# Patient Record
Sex: Female | Born: 1940 | Race: White | Hispanic: No
Health system: Southern US, Community
[De-identification: ages and names within clinical notes are randomized; demographics above are authoritative.]

---

## 2009-02-11 ENCOUNTER — Ambulatory Visit: Admission: RE | Admit: 2009-02-11 | Discharge: 2009-02-11 | Payer: Self-pay | Admitting: Gynecology

## 2009-02-24 ENCOUNTER — Encounter: Payer: Self-pay | Admitting: Gynecologic Oncology

## 2009-02-24 ENCOUNTER — Inpatient Hospital Stay (HOSPITAL_COMMUNITY): Admission: RE | Admit: 2009-02-24 | Discharge: 2009-02-26 | Payer: Self-pay | Admitting: Obstetrics & Gynecology

## 2009-04-01 ENCOUNTER — Ambulatory Visit: Admission: RE | Admit: 2009-04-01 | Discharge: 2009-04-01 | Payer: Self-pay | Admitting: Gynecologic Oncology

## 2010-11-11 IMAGING — CR DG CHEST 2V
2 series · 2 of 2 positions shown · non-contrast
Comparison: None

CLINICAL DATA: Preoperative study for pelvic mass.

CHEST - 2 VIEW

[view not recorded (1 of 2)]
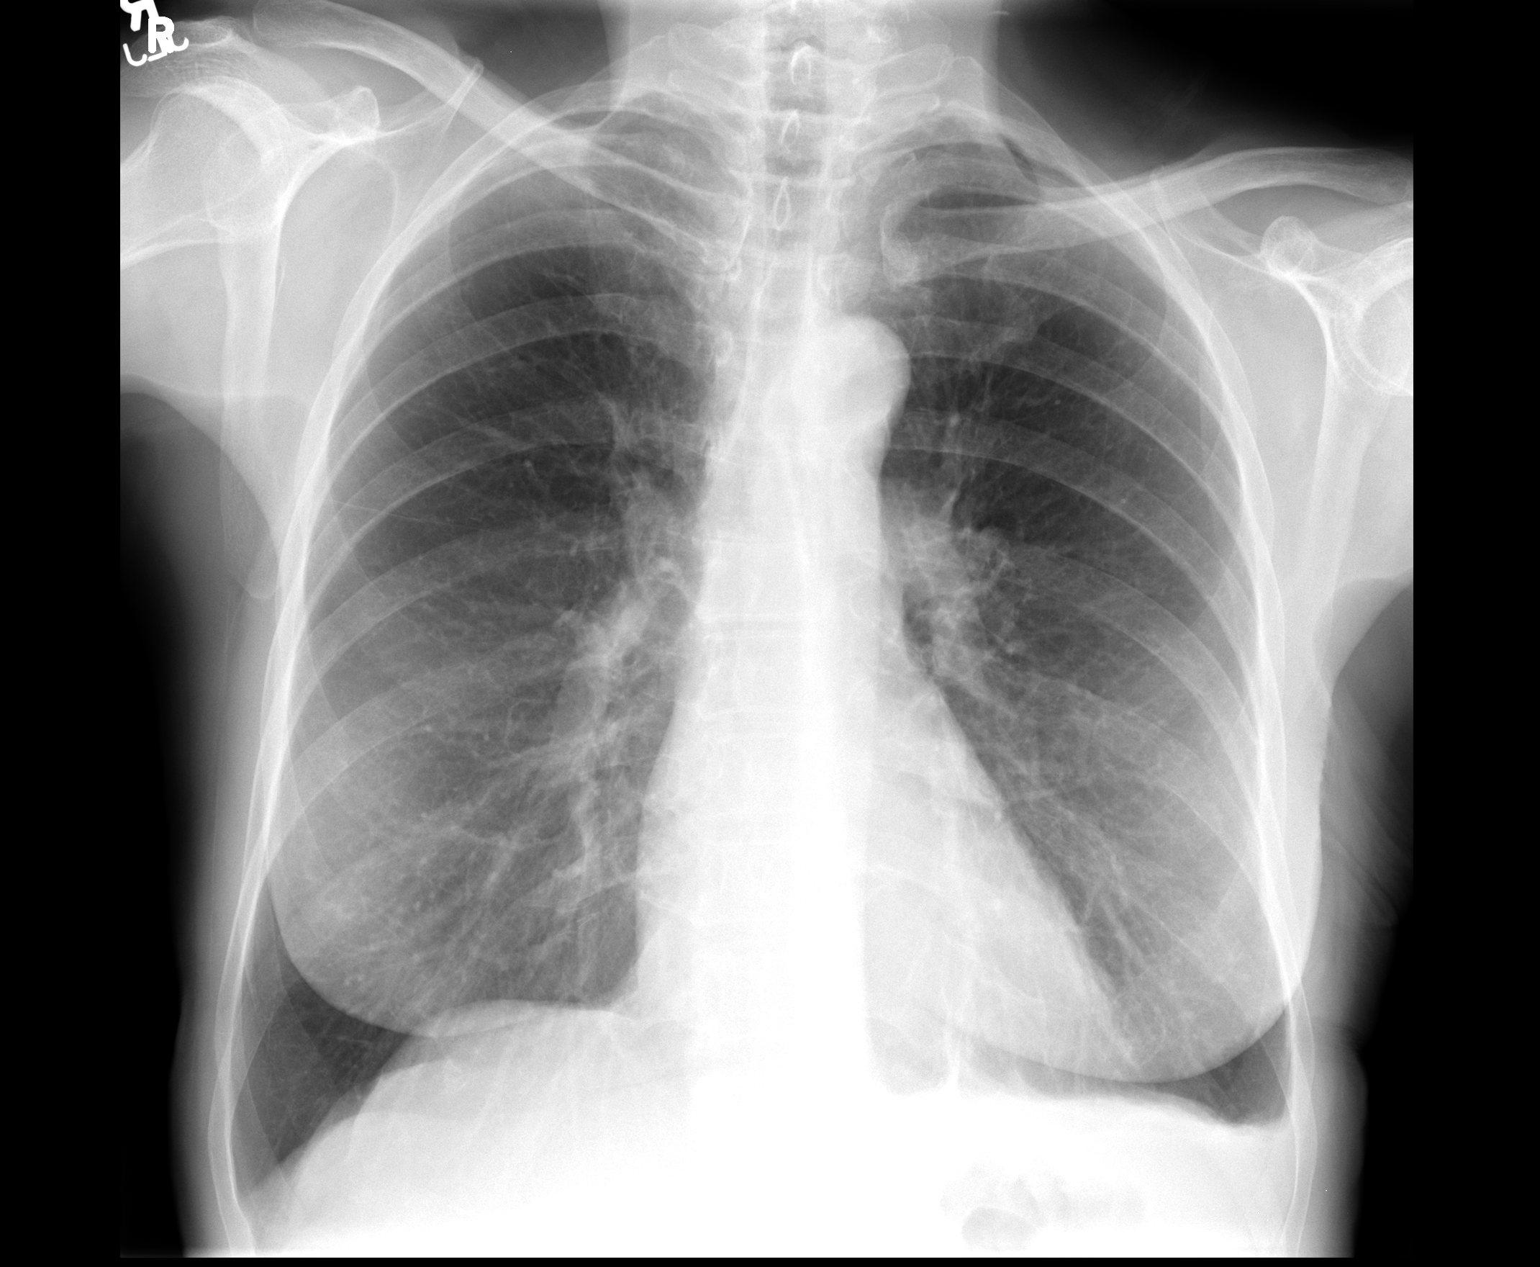

[view not recorded (2 of 2)]
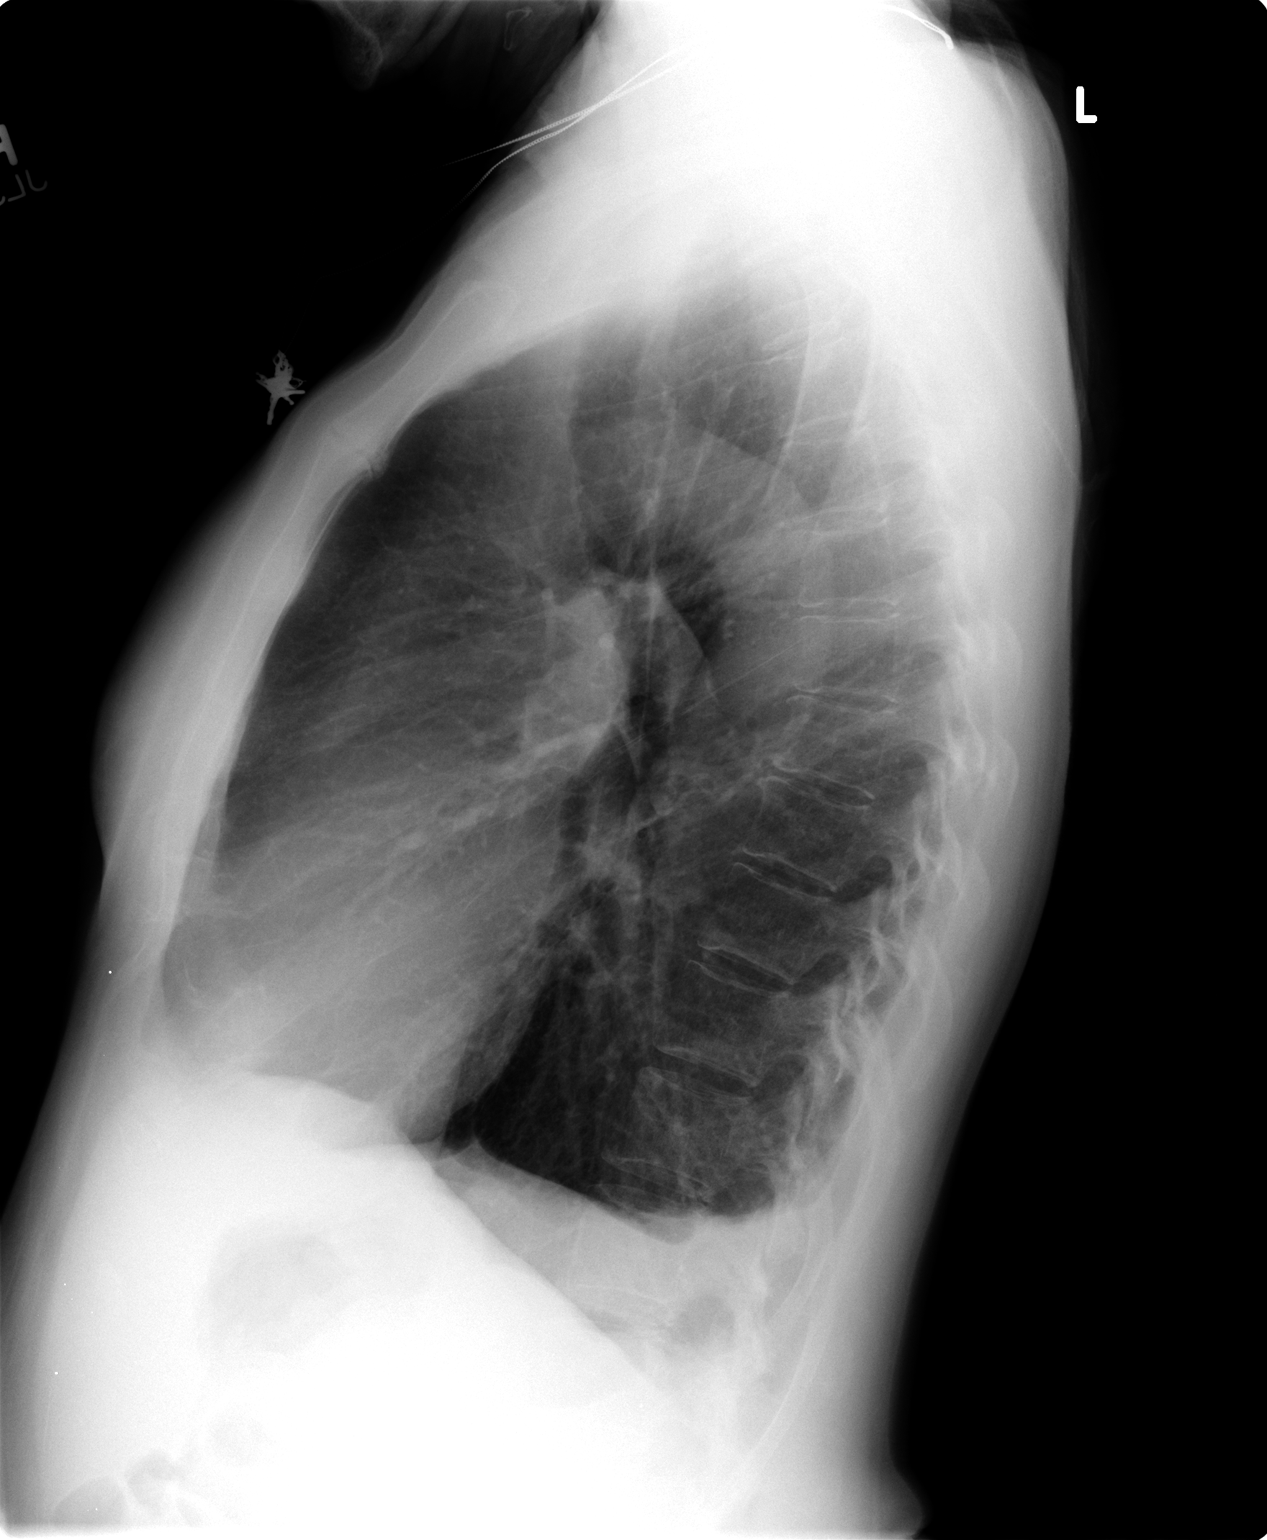

[2 of 2 positions shown; findings below may reference images not displayed]

FINDINGS: The heart size is normal and the vascularity is normal.
There is a small left pleural effusion and mild left lower lobe
atelectasis.  No mass lesion is identified.  There is COPD with
hyperinflation.
IMPRESSION: COPD with small left pleural effusion.

## 2011-04-07 LAB — BASIC METABOLIC PANEL
Calcium: 8.1 mg/dL — ABNORMAL LOW (ref 8.4–10.5)
GFR calc non Af Amer: >60 mL/min (ref >60)
Potassium: 3.6 mEq/L (ref 3.5–5.1)
Sodium: 135 mEq/L (ref 135–145)

## 2011-04-07 LAB — CBC
HCT: 37.9 % (ref 36.0–46.0)
Hemoglobin: 12.6 g/dL (ref 12.0–15.0)
Platelets: 184 10*3/uL (ref 150–400)
WBC: 8.4 10*3/uL (ref 4.0–10.5)

## 2011-04-12 LAB — COMPREHENSIVE METABOLIC PANEL
ALT: 11 U/L (ref 0–35)
AST: 15 U/L (ref 0–37)
Albumin: 3.6 g/dL (ref 3.5–5.2)
Alkaline Phosphatase: 56 U/L (ref 39–117)
BUN: 6 mg/dL (ref 6–23)
Chloride: 99 mEq/L (ref 96–112)
Potassium: 3.5 mEq/L (ref 3.5–5.1)
Sodium: 135 mEq/L (ref 135–145)
Total Bilirubin: 0.4 mg/dL (ref 0.3–1.2)
Total Protein: 6.4 g/dL (ref 6.0–8.3)

## 2011-04-12 LAB — DIFFERENTIAL
Basophils Absolute: 0 10*3/uL (ref 0.0–0.1)
Basophils Relative: 0 % (ref 0–1)
Eosinophils Absolute: 0.1 10*3/uL (ref 0.0–0.7)
Eosinophils Relative: 2 % (ref 0–5)
Monocytes Absolute: 0.4 10*3/uL (ref 0.1–1.0)
Monocytes Relative: 6 % (ref 3–12)
Neutro Abs: 5.5 10*3/uL (ref 1.7–7.7)

## 2011-04-12 LAB — CBC
HCT: 40 % (ref 36.0–46.0)
Hemoglobin: 13.3 g/dL (ref 12.0–15.0)
Platelets: 284 10*3/uL (ref 150–400)
WBC: 7.1 10*3/uL (ref 4.0–10.5)

## 2011-04-12 LAB — TYPE AND SCREEN: Antibody Screen: NEGATIVE

## 2011-05-10 NOTE — Consult Note (Signed)
NAMEMarland Kitchen  Sherry, Singleton NO.:  [id number]   MEDICAL RECORD NO.:  [id number]          PATIENT TYPE:  OUT   LOCATION:  GYN                          FACILITY:  [facility]   PHYSICIAN:  Laurette Schimke, MD     DATE OF BIRTH:  22-Mar-1941   DATE OF CONSULTATION:  04/01/2009  DATE OF DISCHARGE:                                 CONSULTATION   REASON FOR VISIT:  Postoperative check.   HISTORY OF PRESENT ILLNESS:  This is a 70 year old referred by Dr.  Thamas Jaegers for a pelvic mass.  The preoperative CT scan notes a 14 x 13 x 19  cm complex right adnexal mass.  An endometrial biopsy was in conclusive.  On February 24, 2009, she underwent an exploratory laparotomy, total  abdominal hysterectomy, bilateral salpingo-oophorectomy.  The right  ovary and fallopian tube were notable for an ovary with fibroma.  There  was also noted the presence of a benign endometrium with simple  hyperplasia without atypia and a small benign peritubal cyst on the left  fallopian tube.  Postoperatively, Sherry Singleton has done very well.  At  this visit, she indicates that she has resumed many of her activities of  daily living.  Reports hot flashes.  She states that she has resumed her  tobacco use.   PHYSICAL EXAMINATION:  GENERAL:  This is a well-developed female in no  acute distress.  VITAL SIGNS:  Weight 120 pounds.  ABDOMEN:  Soft, nontender.  Incision is clean, dry and intact.  There is  no evidence of a hernia.  No tenderness or erythema.  PELVIC:  Cuff is intact.  There are no pelvic masses.   IMPRESSION:  1. Status post total abdominal hysterectomy, bilateral salpingo-      oophorectomy for a benign ovarian fibroma and incidental      endometrial hyperplasia without atypia.  Sherry Singleton was informed      the results. She will follow up with Dr Oliva Bustard for routine      GYN care.  2. With respect to her complaints of insomnia, Tylenol PM has been      recommended.  3. With regard to institution  of hormonal therapy, given her extensive      tobacco use, she probably would benefit from statin use prior to      initiation of hormone therapy.      Laurette Schimke, MD  Electronically Signed     WB/MEDQ  D:  04/01/2009  T:  04/01/2009  Job:  [id number]   cc:   Telford Nab, R.N.  [address]  [place], [place] [address]   Oliva Bustard  Fax: [phone]

## 2011-05-10 NOTE — Consult Note (Signed)
NAME:  Sherry Singleton, Sherry Singleton NO.:  [id number]   MEDICAL RECORD NO.:  [id number]          PATIENT TYPE:  OUT   LOCATION:  GYN                          FACILITY:  [facility]   PHYSICIAN:  De Blanch, M.D.DATE OF BIRTH:  17-Jun-1941   DATE OF CONSULTATION:  02/11/2009  DATE OF DISCHARGE:                                 CONSULTATION   CHIEF COMPLAINT:  Ascites and pelvic mass.   HISTORY OF PRESENT ILLNESS:  A 70 year old white female seen in  consultation at the request of Dr. Oliva Bustard regarding management of  newly diagnosed ascites and a pelvic mass.  The patient presented with  abdominal distention of approximately 2 weeks' duration.  A CT scan was  obtained showing a large amount of intra-abdominal ascites, peritoneal  metastatic disease, and a 14 x 13 x 19 cm complex pelvic mass, probably  arising from the right ovary.  In addition, there was an enhancing mass  in the endometrium measuring 4.9 x 4.0 x 3.0 cm.  Endometrial biopsy was  obtained which was inconclusive but did not show any evidence of  malignancy or hyperplasia, although the specimen was scanty.  CA-125 on  February 12 which 330 units per mL.  She denies any nausea, vomiting or  any weight loss.   PAST MEDICAL HISTORY:  Medical illnesses:  None.   CURRENT MEDICATIONS:  None.   DRUG ALLERGIES:  None.   PAST SURGICAL HISTORY:  Bilateral tubal ligation.   OBSTETRICAL HISTORY:  Gravida 5, para 4.   SOCIAL HISTORY:  The patient smokes 1 pack per day.  She has a long  career as a Psychiatric nurse and most recently worked at [employer] at [employer] until last year when she retired completely.  She comes accompanied  by one of her daughters today.   REVIEW OF SYSTEMS:  A 10-point comprehensive review of systems negative  except as noted above.   PHYSICAL EXAMINATION:  Weight 137 pounds, height 5 feet 6-1/2.  Blood  pressure 124/84, pulse 100.  GENERAL:  The patient is a pleasant white female in  no acute distress.  HEENT:  Is negative.  NECK:  Supple without thyromegaly.  There is no supraclavicular or  inguinal adenopathy.  The abdomen is distended with ascites.  She has a shifting dullness and  fluid wave.  No discrete masses or omental cake is noted.  PELVIC EXAM:  EGBUS, vagina, bladder urethra are normal.  Cervix is  normal.  Uterus is difficult to outline, but I believe normal shape,  size, consistency.  There is a right adnexal mass that extends nearly to  the umbilicus which is difficult to fully outline because of the  patient's abdominal distention from the ascites.  Rectovaginal exam  confirms.  I do not feel any cul-de-sac nodularity.   IMPRESSION:  Large abdominal pelvic mass which is complex, ascites, and  elevated CA-125.  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  I indicated that I would recommend she undergo  exploratory laparotomy, total abdominal hysterectomy, bilateral salpingo-  oophorectomy, intraoperative frozen section and debulking and/or  surgical staging if she is found to have a malignancy.  We will go ahead  with preoperative testing, and the patient wished to proceed with  surgery as soon as it is possible.      De Blanch, M.D.  Electronically Signed     DC/MEDQ  D:  02/11/2009  T:  02/11/2009  Job:  [id number]   cc:   Oliva Bustard  Fax: [phone]   Celso Amy  [facility]   Telford Nab, R.N.  [phone] [address]  [place], [place] [address]

## 2011-05-10 NOTE — Discharge Summary (Signed)
NAMEMarland Kitchen  Sherry Singleton, Sherry Singleton NO.:  [id number]   MEDICAL RECORD NO.:  [id number]          PATIENT TYPE:  INP   LOCATION:  1523                         FACILITY:  [facility]   PHYSICIAN:  Roseanna Rainbow, M.D.DATE OF BIRTH:  09-05-1941   DATE OF ADMISSION:  02/24/2009  DATE OF DISCHARGE:  02/26/2009                               DISCHARGE SUMMARY   CHIEF COMPLAINT:  The patient is a 70 year old who presents for  operative management of a newly diagnosed pelvic mass with associated  ascites.  Please see the dictated history and physical as per Dr. De Blanch.   HOSPITAL COURSE:  The patient was admitted and underwent total abdominal  hysterectomy and bilateral salpingo-oophorectomy.  Please see the  dictated operative summary.  Her postoperative course was uneventful.  Her diet was gradually advanced.  She was discharged to home on  postoperative day 2 tolerating a regular diet.   DISCHARGE DIAGNOSES:  Right ovarian fibroma, paratubal cyst of  follicular and serous cyst, chronic cervicitis, endometrial polyp,  simple hyperplasia without atypia, leiomyomata.   PROCEDURES:  Total abdominal hysterectomy and bilateral salpingo-  oophorectomy.   CONDITION:  Good.   DIET:  Regular.   ACTIVITY:  Pelvic rest, progressive activity.   MEDICATIONS:  Percocet 1-2 tablets every 6 hours as needed.   FOLLOW UP:  The patient was to follow up in the GYN Oncology office on  March 02, 2009 at 10 a.m.      Roseanna Rainbow, M.D.  Electronically Signed     LAJ/MEDQ  D:  03/31/2009  T:  03/31/2009  Job:  [id number]   cc:   Telford Nab, R.N.  [address]  [place], [place] [address]   [facility]   Oliva Bustard  Fax: [phone]

## 2011-05-10 NOTE — Op Note (Signed)
NAMEMarland Kitchen  CYENNA, REBELLO NO.:  [id number]   MEDICAL RECORD NO.:  [id number]          PATIENT TYPE:  INP   LOCATION:  1523                         FACILITY:  [facility]   PHYSICIAN:  Paola A. Duard Brady, MD    DATE OF BIRTH:  29-Nov-1941   DATE OF PROCEDURE:  02/24/2009  DATE OF DISCHARGE:                               OPERATIVE REPORT   PREOPERATIVE DIAGNOSIS:  Pelvic mass, elevated CA-125.   POSTOPERATIVE DIAGNOSIS:  Fibroma with torsion and necrosis.   PROCEDURE:  Total abdominal hysterectomy, bilateral salpingo-  oophorectomy.   SURGEON:  Paola A. Duard Brady, M.D, Roseanna Rainbow, M.D.   ASSISTANT:  Telford Nab, R.N.   ANESTHESIA:  General.   ANESTHESIOLOGIST:  Quentin Cornwall. Council Mechanic, M.D.   ESTIMATED BLOOD LOSS:  100 ml.   URINE OUTPUT:  125 ml.   IV FLUIDS:  2100 ml.   SPECIMENS:  Included washings of uterus, cervix.  Bilateral tubes and  ovaries sent to pathology.   OPERATIVE FINDINGS:  A 16 cm right ovarian mass that appeared both  cystic and solid with evidence of some necrosis.  The uterus and  contralateral adnexa were unremarkable.  The peritoneal surfaces were  smooth.  There was no evidence of any peritoneal-based disease or  omental thickening.  Frozen section returned as a benign fibroma with  evidence of necrosis and torsion.   Patient was taken to the operating room, placed in supine position,  where general anesthesia was induced.  She was then placed in the dorsal  lithotomy position with all appropriate precautions and prophylaxis.  The patient's abdomen was prepped in the usual fashion.  Perineum and  vagina were prepped in the usual sterile fashion, and a Foley catheter  was inserted into the bladder under sterile conditions.  The patient was  then draped in the usual fashion.  A time-out was performed to confirm  the patient, the procedure, antibiotic, and allergy status as well as  surgeons.  A vertical midline infraumbilical incision  was made.  Patient's old scar from her tubal ligation was excised.  We dissected  down to the underlying fascia using Bovie cautery.  The fascia was  identified, scored in the midline of the fascia.  The incision was  extended superiorly and inferiorly.  The peritoneal cavity was  identified, and the peritoneal incision was extended superiorly and  inferiorly for visualization of the peritoneal cavity.  At this point,  examination revealed the adnexal masses noted.  There was approximately  50 ml of amber-colored ascites that was sent to pathology.  The right  adnexa was delivered from the patient's abdomen without difficulty.  We  then clamped across the uterine ovarian pedicle with 2 curved Masterson  clamps.  The specimens were dissected and sent to pathology.  We suture-  ligated the pedicle of the utero-ovarian.  The Bookwalter self-retaining  retractor was then placed in the patient's abdomen with all appropriate  precautions.  Patient was placed in Trendelenburg, and the small bowel  was packed out of the way with moist laparotomy sponges.  Additional  abdominopelvic washings  were obtained.  The uterus was grasped with  curved Mastersons at the level of the cornua.  The sidewall retractors  were placed.  At this point and in all portions of the procedure, we  examined the side-wall retractors to insure that there is no significant  pressure on the psoas bellies.  Our attention was first drawn to the  left pelvic side wall.  The round ligament was transected with monopolar  cautery.  The anterior and posterior leafs of the broad ligament were  then opened using monopolar cautery.  The ureter was identified.  A  window was made between the IP and ureter.  The IP was clamped x2,  transected and suture ligated.  We then skeletonized the uterine artery  after creating the bladder flap.  The uterine artery was clamped with  curved Masterson at the level of the cervical isthmic junction.   It was  transected and suture ligated.  Our attention was then drawn to the  patient's right side, and a similar procedure was performed.  We opened  the anterior and posterior lips of the broad ligament.  The ureters were  identified, and a window was made between the IP and the ureter.  The IP  was clamped x2, transected, and suture-ligated.  We then skeletonized  the uterine vessel on the patient's right side and created the bladder  flap adequately.  At the cervical isthmic junction, we came across with  a curved Masterson, transected the uterine vessel, suture ligating it.  There was a small amount of bleeding on the peritoneal surface with a  small vessel.  We then dissected the ureter well free of the medial  peritoneum on the patient's left side and 3 hemoclips were placed to  obtain hemostasis.  Again, we rechecked to make sure that the ureter was  well medial of this area of the hemoclip.  We then continued down the  cardinal ligament with each clamp, creating a pedicle with a curved  Masterson, transecting until we came to the cervical vaginal junction.  We then came across the cervix at the level of the vagina.  The cervix  was amputated from the vagina.  The vaginal cuff was closed using figure-  of-eight sutures of 0 Vicryl.  At this point, frozen section returned as  benign disease.  All pedicles were resected.  The abdomen and pelvis  were copiously irrigated.  All laparotomy sponges were removed.  Retractors were removed.  The laparotomy sponge was correct.  The fascia  was closed in a mass closure with a #1 PDS.  The subcu tissues were  irrigated and then made hemostatic.  The skin was closed using skin  clips.   The patient tolerated the procedure well and was taken to the recovery  room in stable condition.  All instrument, needle, RayTec, and  laparotomy counts were correct x2.      Paola A. Duard Brady, MD  Electronically Signed     PAG/MEDQ  D:  02/24/2009  T:   02/24/2009  Job:  [id number]   cc:   Oliva Bustard  Fax: [phone]   Roseanna Rainbow, M.D.  Fax: [phone]   Telford Nab, R.N.  [address]  [place], [place] [address]

## 2022-08-04 DIAGNOSIS — Z7901 Long term (current) use of anticoagulants: Secondary | ICD-10-CM | POA: Diagnosis not present

## 2022-08-04 DIAGNOSIS — M810 Age-related osteoporosis without current pathological fracture: Secondary | ICD-10-CM | POA: Diagnosis not present

## 2022-08-04 DIAGNOSIS — R110 Nausea: Secondary | ICD-10-CM | POA: Diagnosis not present

## 2022-08-04 DIAGNOSIS — S72002D Fracture of unspecified part of neck of left femur, subsequent encounter for closed fracture with routine healing: Secondary | ICD-10-CM | POA: Diagnosis not present

## 2022-08-04 DIAGNOSIS — R0902 Hypoxemia: Secondary | ICD-10-CM | POA: Diagnosis not present

## 2022-08-04 DIAGNOSIS — Z7401 Bed confinement status: Secondary | ICD-10-CM | POA: Diagnosis not present

## 2022-08-04 DIAGNOSIS — J439 Emphysema, unspecified: Secondary | ICD-10-CM | POA: Diagnosis not present

## 2022-08-04 DIAGNOSIS — W19XXXA Unspecified fall, initial encounter: Secondary | ICD-10-CM | POA: Diagnosis not present

## 2022-08-04 DIAGNOSIS — M84652A Pathological fracture in other disease, left femur, initial encounter for fracture: Secondary | ICD-10-CM | POA: Diagnosis not present

## 2022-08-04 DIAGNOSIS — R0602 Shortness of breath: Secondary | ICD-10-CM | POA: Diagnosis not present

## 2022-08-04 DIAGNOSIS — K5900 Constipation, unspecified: Secondary | ICD-10-CM | POA: Diagnosis not present

## 2022-08-04 DIAGNOSIS — R52 Pain, unspecified: Secondary | ICD-10-CM | POA: Diagnosis not present

## 2022-08-04 DIAGNOSIS — Z743 Need for continuous supervision: Secondary | ICD-10-CM | POA: Diagnosis not present

## 2022-08-04 DIAGNOSIS — R42 Dizziness and giddiness: Secondary | ICD-10-CM | POA: Diagnosis not present

## 2022-08-04 DIAGNOSIS — I700 Atherosclerosis of aorta: Secondary | ICD-10-CM | POA: Diagnosis not present

## 2022-08-04 DIAGNOSIS — M25552 Pain in left hip: Secondary | ICD-10-CM | POA: Diagnosis not present

## 2022-08-04 DIAGNOSIS — F17210 Nicotine dependence, cigarettes, uncomplicated: Secondary | ICD-10-CM | POA: Diagnosis not present

## 2022-08-04 DIAGNOSIS — G4700 Insomnia, unspecified: Secondary | ICD-10-CM | POA: Diagnosis not present

## 2022-08-04 DIAGNOSIS — W19XXXD Unspecified fall, subsequent encounter: Secondary | ICD-10-CM | POA: Diagnosis not present

## 2022-08-04 DIAGNOSIS — W010XXA Fall on same level from slipping, tripping and stumbling without subsequent striking against object, initial encounter: Secondary | ICD-10-CM | POA: Diagnosis not present

## 2022-08-04 DIAGNOSIS — S72012A Unspecified intracapsular fracture of left femur, initial encounter for closed fracture: Secondary | ICD-10-CM | POA: Diagnosis not present

## 2022-08-04 DIAGNOSIS — S72002A Fracture of unspecified part of neck of left femur, initial encounter for closed fracture: Secondary | ICD-10-CM | POA: Diagnosis not present

## 2022-08-04 DIAGNOSIS — M1612 Unilateral primary osteoarthritis, left hip: Secondary | ICD-10-CM | POA: Diagnosis not present

## 2022-08-04 DIAGNOSIS — S72042D Displaced fracture of base of neck of left femur, subsequent encounter for closed fracture with routine healing: Secondary | ICD-10-CM | POA: Diagnosis not present

## 2022-08-04 DIAGNOSIS — Z96642 Presence of left artificial hip joint: Secondary | ICD-10-CM | POA: Diagnosis not present

## 2022-08-04 DIAGNOSIS — S299XXA Unspecified injury of thorax, initial encounter: Secondary | ICD-10-CM | POA: Diagnosis not present

## 2022-08-04 DIAGNOSIS — Z471 Aftercare following joint replacement surgery: Secondary | ICD-10-CM | POA: Diagnosis not present

## 2022-08-04 DIAGNOSIS — Z4889 Encounter for other specified surgical aftercare: Secondary | ICD-10-CM | POA: Diagnosis not present

## 2022-08-04 DIAGNOSIS — Z4789 Encounter for other orthopedic aftercare: Secondary | ICD-10-CM | POA: Diagnosis not present

## 2022-08-04 DIAGNOSIS — R531 Weakness: Secondary | ICD-10-CM | POA: Diagnosis not present

## 2022-08-04 DIAGNOSIS — Z720 Tobacco use: Secondary | ICD-10-CM | POA: Diagnosis not present

## 2022-08-04 DIAGNOSIS — R5381 Other malaise: Secondary | ICD-10-CM | POA: Diagnosis not present

## 2022-08-06 DIAGNOSIS — Z7901 Long term (current) use of anticoagulants: Secondary | ICD-10-CM | POA: Diagnosis not present

## 2022-08-06 DIAGNOSIS — D649 Anemia, unspecified: Secondary | ICD-10-CM | POA: Diagnosis not present

## 2022-08-06 DIAGNOSIS — F17210 Nicotine dependence, cigarettes, uncomplicated: Secondary | ICD-10-CM | POA: Diagnosis not present

## 2022-08-06 DIAGNOSIS — W19XXXD Unspecified fall, subsequent encounter: Secondary | ICD-10-CM | POA: Diagnosis not present

## 2022-08-06 DIAGNOSIS — S72002D Fracture of unspecified part of neck of left femur, subsequent encounter for closed fracture with routine healing: Secondary | ICD-10-CM | POA: Diagnosis not present

## 2022-08-06 DIAGNOSIS — G4700 Insomnia, unspecified: Secondary | ICD-10-CM | POA: Diagnosis not present

## 2022-08-06 DIAGNOSIS — S72002A Fracture of unspecified part of neck of left femur, initial encounter for closed fracture: Secondary | ICD-10-CM | POA: Diagnosis not present

## 2022-08-06 DIAGNOSIS — R5381 Other malaise: Secondary | ICD-10-CM | POA: Diagnosis not present

## 2022-08-06 DIAGNOSIS — W010XXA Fall on same level from slipping, tripping and stumbling without subsequent striking against object, initial encounter: Secondary | ICD-10-CM | POA: Diagnosis not present

## 2022-08-06 DIAGNOSIS — Z4789 Encounter for other orthopedic aftercare: Secondary | ICD-10-CM | POA: Diagnosis not present

## 2022-08-06 DIAGNOSIS — Z7401 Bed confinement status: Secondary | ICD-10-CM | POA: Diagnosis not present

## 2022-08-06 DIAGNOSIS — R52 Pain, unspecified: Secondary | ICD-10-CM | POA: Diagnosis not present

## 2022-08-06 DIAGNOSIS — M25552 Pain in left hip: Secondary | ICD-10-CM | POA: Diagnosis not present

## 2022-08-06 DIAGNOSIS — K5900 Constipation, unspecified: Secondary | ICD-10-CM | POA: Diagnosis not present

## 2022-08-06 DIAGNOSIS — Z96642 Presence of left artificial hip joint: Secondary | ICD-10-CM | POA: Diagnosis not present

## 2022-08-06 DIAGNOSIS — S72012A Unspecified intracapsular fracture of left femur, initial encounter for closed fracture: Secondary | ICD-10-CM | POA: Diagnosis not present

## 2022-08-06 DIAGNOSIS — Z720 Tobacco use: Secondary | ICD-10-CM | POA: Diagnosis not present

## 2022-08-06 DIAGNOSIS — S72042D Displaced fracture of base of neck of left femur, subsequent encounter for closed fracture with routine healing: Secondary | ICD-10-CM | POA: Diagnosis not present

## 2022-08-06 DIAGNOSIS — R531 Weakness: Secondary | ICD-10-CM | POA: Diagnosis not present

## 2022-08-06 DIAGNOSIS — Z4889 Encounter for other specified surgical aftercare: Secondary | ICD-10-CM | POA: Diagnosis not present

## 2022-08-06 DIAGNOSIS — Z743 Need for continuous supervision: Secondary | ICD-10-CM | POA: Diagnosis not present

## 2022-08-06 DIAGNOSIS — M810 Age-related osteoporosis without current pathological fracture: Secondary | ICD-10-CM | POA: Diagnosis not present

## 2022-08-08 DIAGNOSIS — M810 Age-related osteoporosis without current pathological fracture: Secondary | ICD-10-CM | POA: Diagnosis not present

## 2022-08-08 DIAGNOSIS — D649 Anemia, unspecified: Secondary | ICD-10-CM | POA: Diagnosis not present

## 2022-08-08 DIAGNOSIS — S72002A Fracture of unspecified part of neck of left femur, initial encounter for closed fracture: Secondary | ICD-10-CM | POA: Diagnosis not present

## 2022-08-12 ENCOUNTER — Other Ambulatory Visit: Payer: Self-pay | Admitting: *Deleted

## 2022-08-12 NOTE — Patient Outreach (Signed)
Sherry Singleton resides in [name]. Screening for potential [name] care coordination/care management needs as a benefit of insurance plan.  Communication with [name] SW to make aware writer is following for progression and transition plans. [name] reports care plan meeting to be scheduled.  Will continue to follow.  [name], MSN, RN,BSN [name] [phone] [name]) [phone]  (Toll free office)

## 2022-08-15 ENCOUNTER — Other Ambulatory Visit: Payer: Self-pay | Admitting: *Deleted

## 2022-08-15 NOTE — Patient Outreach (Signed)
[facility] Coordinator follow up.  Facility site visit to [facility]. Met with Mrs. Sherry Singleton at beside to discuss transition plans and potential [facility] care coordination/care management needs.   Sherry Singleton reports she lives with her 2 sons. States she is s/p hip surgery and stiches come out tomorrow. States she plans to return home with sons.  Made Sherry Singleton aware that she can call [facility] customer service line for transportation and meal needs. Denies having concerns with transportation and meals.  Discussed need for PCP. Sherry Singleton states she has not needed one in the past. However, admits to needing PCP now as she is aging. Discussed [facility] for home visits as primary care provider.  Sherry Singleton reports she thinks she had a referral for [facility] in the past but she never called back. States she is not interested in home visits due to her dog. Discussed writer will follow up with her to provide PCP information inn her area. Will also make SNF SW aware of conversation regarding PCP needs.   Provided [facility] Care Management brochure, contact information, and 24-hr nurse advice line magnet.   Will continue to follow.    Marthenia Rolling, MSN, RN,BSN [place] Acute Care Coordinator [phone] [facility]) [phone]  (Toll free office)

## 2022-08-19 ENCOUNTER — Other Ambulatory Visit: Payer: Self-pay | Admitting: *Deleted

## 2022-08-19 DIAGNOSIS — Z720 Tobacco use: Secondary | ICD-10-CM | POA: Diagnosis not present

## 2022-08-19 DIAGNOSIS — S72002A Fracture of unspecified part of neck of left femur, initial encounter for closed fracture: Secondary | ICD-10-CM | POA: Diagnosis not present

## 2022-08-19 DIAGNOSIS — M810 Age-related osteoporosis without current pathological fracture: Secondary | ICD-10-CM | POA: Diagnosis not present

## 2022-08-19 NOTE — Patient Outreach (Signed)
[facility] Post- Acute Care Coordinator follow up.  Per [facility] Sherry Singleton discharged from [facility]. Telephone call made to Sherry Singleton at [phone] to confirm she has PCP follow up. No answer. HIPAA compliant voicemail message left to request return call.    [name], MSN, RN,BSN [facility] Post Acute Care Coordinator [phone] [facility]) [phone]  (Toll free office)

## 2022-08-22 DIAGNOSIS — Z7982 Long term (current) use of aspirin: Secondary | ICD-10-CM | POA: Diagnosis not present

## 2022-08-22 DIAGNOSIS — Z87891 Personal history of nicotine dependence: Secondary | ICD-10-CM | POA: Diagnosis not present

## 2022-08-22 DIAGNOSIS — Z96642 Presence of left artificial hip joint: Secondary | ICD-10-CM | POA: Diagnosis not present

## 2022-08-22 DIAGNOSIS — M810 Age-related osteoporosis without current pathological fracture: Secondary | ICD-10-CM | POA: Diagnosis not present

## 2022-08-22 DIAGNOSIS — R54 Age-related physical debility: Secondary | ICD-10-CM | POA: Diagnosis not present

## 2022-08-23 ENCOUNTER — Other Ambulatory Visit: Payer: Self-pay | Admitting: *Deleted

## 2022-08-23 NOTE — Patient Outreach (Signed)
[facility] Post- Acute Care Coordinator follow up. Verified in [facility] Sherry Singleton discharged from [facility] on 08/19/22.   Telephone call made to Sherry Singleton [phone]. Patient identifiers confirmed. Sherry Singleton states she was arranged with PCP follow up with [facility]. Denies having any [facility] care coordination needs.    [name], MSN, RN,BSN [facility] Post Acute Care Coordinator [phone] [facility]) [phone]  (Toll free office)

## 2022-08-24 DIAGNOSIS — S72002D Fracture of unspecified part of neck of left femur, subsequent encounter for closed fracture with routine healing: Secondary | ICD-10-CM | POA: Diagnosis not present

## 2022-08-24 DIAGNOSIS — G894 Chronic pain syndrome: Secondary | ICD-10-CM | POA: Diagnosis not present

## 2022-08-24 DIAGNOSIS — Z7952 Long term (current) use of systemic steroids: Secondary | ICD-10-CM | POA: Diagnosis not present

## 2022-08-24 DIAGNOSIS — Z96652 Presence of left artificial knee joint: Secondary | ICD-10-CM | POA: Diagnosis not present

## 2022-08-24 DIAGNOSIS — Z7982 Long term (current) use of aspirin: Secondary | ICD-10-CM | POA: Diagnosis not present

## 2022-08-24 DIAGNOSIS — K219 Gastro-esophageal reflux disease without esophagitis: Secondary | ICD-10-CM | POA: Diagnosis not present

## 2022-08-25 DIAGNOSIS — K219 Gastro-esophageal reflux disease without esophagitis: Secondary | ICD-10-CM | POA: Diagnosis not present

## 2022-08-25 DIAGNOSIS — Z96652 Presence of left artificial knee joint: Secondary | ICD-10-CM | POA: Diagnosis not present

## 2022-08-25 DIAGNOSIS — Z7952 Long term (current) use of systemic steroids: Secondary | ICD-10-CM | POA: Diagnosis not present

## 2022-08-25 DIAGNOSIS — S72002D Fracture of unspecified part of neck of left femur, subsequent encounter for closed fracture with routine healing: Secondary | ICD-10-CM | POA: Diagnosis not present

## 2022-08-25 DIAGNOSIS — Z7982 Long term (current) use of aspirin: Secondary | ICD-10-CM | POA: Diagnosis not present

## 2022-08-26 DIAGNOSIS — G894 Chronic pain syndrome: Secondary | ICD-10-CM | POA: Diagnosis not present

## 2022-08-30 DIAGNOSIS — Z7952 Long term (current) use of systemic steroids: Secondary | ICD-10-CM | POA: Diagnosis not present

## 2022-08-30 DIAGNOSIS — Z7982 Long term (current) use of aspirin: Secondary | ICD-10-CM | POA: Diagnosis not present

## 2022-08-30 DIAGNOSIS — K219 Gastro-esophageal reflux disease without esophagitis: Secondary | ICD-10-CM | POA: Diagnosis not present

## 2022-08-30 DIAGNOSIS — S72002D Fracture of unspecified part of neck of left femur, subsequent encounter for closed fracture with routine healing: Secondary | ICD-10-CM | POA: Diagnosis not present

## 2022-08-30 DIAGNOSIS — Z96652 Presence of left artificial knee joint: Secondary | ICD-10-CM | POA: Diagnosis not present

## 2022-08-31 DIAGNOSIS — Z2821 Immunization not carried out because of patient refusal: Secondary | ICD-10-CM | POA: Diagnosis not present

## 2022-08-31 DIAGNOSIS — J3489 Other specified disorders of nose and nasal sinuses: Secondary | ICD-10-CM | POA: Diagnosis not present

## 2022-08-31 DIAGNOSIS — Z87891 Personal history of nicotine dependence: Secondary | ICD-10-CM | POA: Diagnosis not present

## 2022-08-31 DIAGNOSIS — M810 Age-related osteoporosis without current pathological fracture: Secondary | ICD-10-CM | POA: Diagnosis not present

## 2022-08-31 DIAGNOSIS — Z96642 Presence of left artificial hip joint: Secondary | ICD-10-CM | POA: Diagnosis not present

## 2022-09-01 DIAGNOSIS — Z7952 Long term (current) use of systemic steroids: Secondary | ICD-10-CM | POA: Diagnosis not present

## 2022-09-01 DIAGNOSIS — S72002D Fracture of unspecified part of neck of left femur, subsequent encounter for closed fracture with routine healing: Secondary | ICD-10-CM | POA: Diagnosis not present

## 2022-09-01 DIAGNOSIS — K219 Gastro-esophageal reflux disease without esophagitis: Secondary | ICD-10-CM | POA: Diagnosis not present

## 2022-09-01 DIAGNOSIS — Z7982 Long term (current) use of aspirin: Secondary | ICD-10-CM | POA: Diagnosis not present

## 2022-09-01 DIAGNOSIS — Z96652 Presence of left artificial knee joint: Secondary | ICD-10-CM | POA: Diagnosis not present

## 2022-09-05 DIAGNOSIS — S72002D Fracture of unspecified part of neck of left femur, subsequent encounter for closed fracture with routine healing: Secondary | ICD-10-CM | POA: Diagnosis not present

## 2022-09-05 DIAGNOSIS — K219 Gastro-esophageal reflux disease without esophagitis: Secondary | ICD-10-CM | POA: Diagnosis not present

## 2022-09-05 DIAGNOSIS — Z7982 Long term (current) use of aspirin: Secondary | ICD-10-CM | POA: Diagnosis not present

## 2022-09-05 DIAGNOSIS — Z96652 Presence of left artificial knee joint: Secondary | ICD-10-CM | POA: Diagnosis not present

## 2022-09-05 DIAGNOSIS — Z7952 Long term (current) use of systemic steroids: Secondary | ICD-10-CM | POA: Diagnosis not present

## 2022-09-07 DIAGNOSIS — K219 Gastro-esophageal reflux disease without esophagitis: Secondary | ICD-10-CM | POA: Diagnosis not present

## 2022-09-07 DIAGNOSIS — Z7952 Long term (current) use of systemic steroids: Secondary | ICD-10-CM | POA: Diagnosis not present

## 2022-09-07 DIAGNOSIS — Z7982 Long term (current) use of aspirin: Secondary | ICD-10-CM | POA: Diagnosis not present

## 2022-09-07 DIAGNOSIS — Z96652 Presence of left artificial knee joint: Secondary | ICD-10-CM | POA: Diagnosis not present

## 2022-09-07 DIAGNOSIS — S72002D Fracture of unspecified part of neck of left femur, subsequent encounter for closed fracture with routine healing: Secondary | ICD-10-CM | POA: Diagnosis not present

## 2022-09-08 DIAGNOSIS — Z7982 Long term (current) use of aspirin: Secondary | ICD-10-CM | POA: Diagnosis not present

## 2022-09-08 DIAGNOSIS — Z96652 Presence of left artificial knee joint: Secondary | ICD-10-CM | POA: Diagnosis not present

## 2022-09-08 DIAGNOSIS — Z7952 Long term (current) use of systemic steroids: Secondary | ICD-10-CM | POA: Diagnosis not present

## 2022-09-08 DIAGNOSIS — S72002D Fracture of unspecified part of neck of left femur, subsequent encounter for closed fracture with routine healing: Secondary | ICD-10-CM | POA: Diagnosis not present

## 2022-09-08 DIAGNOSIS — K219 Gastro-esophageal reflux disease without esophagitis: Secondary | ICD-10-CM | POA: Diagnosis not present

## 2022-09-13 DIAGNOSIS — Z87891 Personal history of nicotine dependence: Secondary | ICD-10-CM | POA: Diagnosis not present

## 2022-09-13 DIAGNOSIS — G4701 Insomnia due to medical condition: Secondary | ICD-10-CM | POA: Diagnosis not present

## 2022-09-14 DIAGNOSIS — S72002A Fracture of unspecified part of neck of left femur, initial encounter for closed fracture: Secondary | ICD-10-CM | POA: Diagnosis not present

## 2022-09-15 DIAGNOSIS — Z7982 Long term (current) use of aspirin: Secondary | ICD-10-CM | POA: Diagnosis not present

## 2022-09-15 DIAGNOSIS — Z96652 Presence of left artificial knee joint: Secondary | ICD-10-CM | POA: Diagnosis not present

## 2022-09-15 DIAGNOSIS — Z7952 Long term (current) use of systemic steroids: Secondary | ICD-10-CM | POA: Diagnosis not present

## 2022-09-15 DIAGNOSIS — S72002D Fracture of unspecified part of neck of left femur, subsequent encounter for closed fracture with routine healing: Secondary | ICD-10-CM | POA: Diagnosis not present

## 2022-09-15 DIAGNOSIS — K219 Gastro-esophageal reflux disease without esophagitis: Secondary | ICD-10-CM | POA: Diagnosis not present

## 2022-09-16 DIAGNOSIS — Z7982 Long term (current) use of aspirin: Secondary | ICD-10-CM | POA: Diagnosis not present

## 2022-09-16 DIAGNOSIS — S72002D Fracture of unspecified part of neck of left femur, subsequent encounter for closed fracture with routine healing: Secondary | ICD-10-CM | POA: Diagnosis not present

## 2022-09-16 DIAGNOSIS — Z96652 Presence of left artificial knee joint: Secondary | ICD-10-CM | POA: Diagnosis not present

## 2022-09-16 DIAGNOSIS — K219 Gastro-esophageal reflux disease without esophagitis: Secondary | ICD-10-CM | POA: Diagnosis not present

## 2022-09-16 DIAGNOSIS — Z7952 Long term (current) use of systemic steroids: Secondary | ICD-10-CM | POA: Diagnosis not present

## 2022-09-24 DIAGNOSIS — G894 Chronic pain syndrome: Secondary | ICD-10-CM | POA: Diagnosis not present

## 2022-10-04 DIAGNOSIS — Z87891 Personal history of nicotine dependence: Secondary | ICD-10-CM | POA: Diagnosis not present

## 2022-10-04 DIAGNOSIS — Z2821 Immunization not carried out because of patient refusal: Secondary | ICD-10-CM | POA: Diagnosis not present

## 2022-10-04 DIAGNOSIS — M546 Pain in thoracic spine: Secondary | ICD-10-CM | POA: Diagnosis not present

## 2022-10-10 DIAGNOSIS — G894 Chronic pain syndrome: Secondary | ICD-10-CM | POA: Diagnosis not present

## 2022-10-26 DIAGNOSIS — S72002A Fracture of unspecified part of neck of left femur, initial encounter for closed fracture: Secondary | ICD-10-CM | POA: Diagnosis not present

## 2022-11-02 DIAGNOSIS — M810 Age-related osteoporosis without current pathological fracture: Secondary | ICD-10-CM | POA: Diagnosis not present

## 2022-11-02 DIAGNOSIS — Z87891 Personal history of nicotine dependence: Secondary | ICD-10-CM | POA: Diagnosis not present

## 2022-11-02 DIAGNOSIS — B351 Tinea unguium: Secondary | ICD-10-CM | POA: Diagnosis not present

## 2022-11-02 DIAGNOSIS — G4701 Insomnia due to medical condition: Secondary | ICD-10-CM | POA: Diagnosis not present

## 2022-11-24 DIAGNOSIS — M810 Age-related osteoporosis without current pathological fracture: Secondary | ICD-10-CM | POA: Diagnosis not present

## 2022-12-13 DIAGNOSIS — M810 Age-related osteoporosis without current pathological fracture: Secondary | ICD-10-CM | POA: Diagnosis not present

## 2022-12-23 DIAGNOSIS — J329 Chronic sinusitis, unspecified: Secondary | ICD-10-CM | POA: Diagnosis not present

## 2022-12-23 DIAGNOSIS — M159 Polyosteoarthritis, unspecified: Secondary | ICD-10-CM | POA: Diagnosis not present

## 2022-12-23 DIAGNOSIS — Z87891 Personal history of nicotine dependence: Secondary | ICD-10-CM | POA: Diagnosis not present

## 2022-12-23 DIAGNOSIS — B351 Tinea unguium: Secondary | ICD-10-CM | POA: Diagnosis not present

## 2022-12-23 DIAGNOSIS — M810 Age-related osteoporosis without current pathological fracture: Secondary | ICD-10-CM | POA: Diagnosis not present

## 2022-12-23 DIAGNOSIS — G4701 Insomnia due to medical condition: Secondary | ICD-10-CM | POA: Diagnosis not present

## 2023-01-18 DIAGNOSIS — Z87891 Personal history of nicotine dependence: Secondary | ICD-10-CM | POA: Diagnosis not present

## 2023-01-18 DIAGNOSIS — R54 Age-related physical debility: Secondary | ICD-10-CM | POA: Diagnosis not present

## 2023-01-18 DIAGNOSIS — B351 Tinea unguium: Secondary | ICD-10-CM | POA: Diagnosis not present

## 2023-01-18 DIAGNOSIS — M810 Age-related osteoporosis without current pathological fracture: Secondary | ICD-10-CM | POA: Diagnosis not present

## 2023-01-18 DIAGNOSIS — G4701 Insomnia due to medical condition: Secondary | ICD-10-CM | POA: Diagnosis not present

## 2023-01-24 DIAGNOSIS — M810 Age-related osteoporosis without current pathological fracture: Secondary | ICD-10-CM | POA: Diagnosis not present

## 2023-01-30 DIAGNOSIS — S72002A Fracture of unspecified part of neck of left femur, initial encounter for closed fracture: Secondary | ICD-10-CM | POA: Diagnosis not present

## 2023-02-22 DIAGNOSIS — M810 Age-related osteoporosis without current pathological fracture: Secondary | ICD-10-CM | POA: Diagnosis not present

## 2023-02-22 DIAGNOSIS — M159 Polyosteoarthritis, unspecified: Secondary | ICD-10-CM | POA: Diagnosis not present

## 2023-02-22 DIAGNOSIS — G894 Chronic pain syndrome: Secondary | ICD-10-CM | POA: Diagnosis not present

## 2023-02-22 DIAGNOSIS — R54 Age-related physical debility: Secondary | ICD-10-CM | POA: Diagnosis not present
